# Patient Record
Sex: Male | Born: 1990 | Race: Black or African American | Hispanic: No | Marital: Single | Smoking: Current every day smoker
Health system: Southern US, Community
[De-identification: ages and names within clinical notes are randomized; demographics above are authoritative.]

## PROBLEM LIST (undated history)

## (undated) DIAGNOSIS — J45909 Unspecified asthma, uncomplicated: Secondary | ICD-10-CM

## (undated) DIAGNOSIS — K922 Gastrointestinal hemorrhage, unspecified: Secondary | ICD-10-CM

## (undated) DIAGNOSIS — F1011 Alcohol abuse, in remission: Secondary | ICD-10-CM

---

## 2013-03-06 ENCOUNTER — Emergency Department (HOSPITAL_COMMUNITY)
Admission: EM | Admit: 2013-03-06 | Discharge: 2013-03-06 | Disposition: A | Discharge status: Home / Self Care | Payer: Self-pay | Attending: Emergency Medicine | Admitting: Emergency Medicine

## 2013-03-06 ENCOUNTER — Encounter (HOSPITAL_COMMUNITY): Payer: Self-pay

## 2013-03-06 DIAGNOSIS — L03317 Cellulitis of buttock: Secondary | ICD-10-CM | POA: Insufficient documentation

## 2013-03-06 DIAGNOSIS — Z792 Long term (current) use of antibiotics: Secondary | ICD-10-CM | POA: Insufficient documentation

## 2013-03-06 DIAGNOSIS — L0291 Cutaneous abscess, unspecified: Secondary | ICD-10-CM

## 2013-03-06 DIAGNOSIS — F17200 Nicotine dependence, unspecified, uncomplicated: Secondary | ICD-10-CM | POA: Insufficient documentation

## 2013-03-06 DIAGNOSIS — L0231 Cutaneous abscess of buttock: Secondary | ICD-10-CM | POA: Insufficient documentation

## 2013-03-06 NOTE — ED Provider Notes (Signed)
CSN: [id number]     Arrival date & time 03/06/13  1940 History    This chart was scribed for Dorthula Matas, non-physician practitioner working with Gilda Crease, * by Leone Payor, ED Scribe. This patient was seen in room TR09C/TR09C and the patient's care was started at 1940.   First MD Initiated Contact with Patient 03/06/13 1954     Chief Complaint  Patient presents with  . Abscess   The history is provided by the patient. No language interpreter was used.    HPI Comments: Zeeshan Korte is a 22 y.o. male who presents to the Emergency Department complaining of an ongoing, constant, gradually worsening abscess to the right buttocks and posterior thigh area starting 10 days ago. He was seen at [facility] yesterday and prescribed doxycycline which he started last night. Pt also reports that the area started to drain on its own earlier today. States the pain has decreased since it started draining. Denies fever.   Pt is a current everyday smoker but denies alcohol use.  History reviewed. No pertinent past medical history. History reviewed. No pertinent past surgical history. History reviewed. No pertinent family history. History  Substance Use Topics  . Smoking status: Current Every Day Smoker  . Smokeless tobacco: Not on file  . Alcohol Use: No    Review of Systems  Constitutional: Negative for fever.  Skin: Positive for wound (abscess).  All other systems reviewed and are negative.    Allergies  Review of patient's allergies indicates no known allergies.  Home Medications   Current Outpatient Rx  Name  Route  Sig  Dispense  Refill  . doxycycline (VIBRA-TABS) 100 MG tablet   Oral   Take 100 mg by mouth 2 (two) times daily.          BP 142/91  Pulse 106  Temp(Src) 98.3 F (36.8 C) (Oral)  Resp 18  SpO2 96% Physical Exam  Nursing note and vitals reviewed. Constitutional: He appears well-developed and well-nourished. No distress.  HENT:  Head: Normocephalic  and atraumatic.  Eyes: Pupils are equal, round, and reactive to light.  Neck: Normal range of motion. Neck supple.  Cardiovascular: Normal rate and regular rhythm.   Pulmonary/Chest: Effort normal.  Abdominal: Soft.  Genitourinary:     Neurological: He is alert.  Skin: Skin is warm and dry.    ED Course   Procedures (including critical care time)  DIAGNOSTIC STUDIES: Oxygen Saturation is 96% on RA, adequate by my interpretation.    COORDINATION OF CARE: 8:09 PM Discussed treatment plan with pt at bedside and pt agreed to plan.   Labs Reviewed - No data to display No results found. 1. Abscess     MDM  Pt to continue doxy prescription. Return to ED precautions given. Abscess had drained on its own, pt concerned when he say it was bleeding and "pussing".  22 y.o.Matheau Iseman's evaluation in the Emergency Department is complete. It has been determined that no acute conditions requiring further emergency intervention are present at this time. The patient/guardian have been advised of the diagnosis and plan. We have discussed signs and symptoms that warrant return to the ED, such as changes or worsening in symptoms.  Vital signs are stable at discharge. Filed Vitals:   03/06/13 1948  BP:   Pulse:   Temp: 98.3 F (36.8 C)  Resp:     Patient/guardian has voiced understanding and agreed to follow-up with the PCP or specialist.  I personally performed the  services described in this documentation, which was scribed in my presence. The recorded information has been reviewed and is accurate.   Dorthula Matas, PA-C 03/06/13 2018

## 2013-03-06 NOTE — ED Notes (Signed)
Pt c/o pain to the area between the Right buttocks and posterior thigh d/t abscess x10 days. Pt reports increase pain on Sunday, seen at [facility] yesterday d/t increase pain with walking. Pt prescribed Doxycycline BID, pt started ABX last night. Pt reports increase pain today with walking and sitting. Pt also reports the area may have "busted open." Pt was instructed to f/u here for further evaluation if pain continued

## 2013-03-07 NOTE — ED Provider Notes (Signed)
Medical screening examination/treatment/procedure(s) were performed by non-physician practitioner and as supervising physician I was immediately available for consultation/collaboration.    Christopher J. Pollina, MD 03/07/13 1546 

## 2015-10-25 ENCOUNTER — Emergency Department (HOSPITAL_COMMUNITY)
Admission: EM | Admit: 2015-10-25 | Discharge: 2015-10-25 | Disposition: A | Discharge status: Home / Self Care | Payer: Self-pay | Attending: Emergency Medicine | Admitting: Emergency Medicine

## 2015-10-25 ENCOUNTER — Encounter (HOSPITAL_COMMUNITY): Payer: Self-pay

## 2015-10-25 DIAGNOSIS — K921 Melena: Secondary | ICD-10-CM | POA: Insufficient documentation

## 2015-10-25 DIAGNOSIS — F17200 Nicotine dependence, unspecified, uncomplicated: Secondary | ICD-10-CM | POA: Insufficient documentation

## 2015-10-25 DIAGNOSIS — K920 Hematemesis: Secondary | ICD-10-CM | POA: Insufficient documentation

## 2015-10-25 DIAGNOSIS — J45909 Unspecified asthma, uncomplicated: Secondary | ICD-10-CM | POA: Insufficient documentation

## 2015-10-25 DIAGNOSIS — K922 Gastrointestinal hemorrhage, unspecified: Secondary | ICD-10-CM

## 2015-10-25 HISTORY — DX: Unspecified asthma, uncomplicated: J45.909

## 2015-10-25 LAB — COMPREHENSIVE METABOLIC PANEL
ALT: 43 U/L (ref 17–63)
AST: 40 U/L (ref 15–41)
Albumin: 4.1 g/dL (ref 3.5–5.0)
Alkaline Phosphatase: 63 U/L (ref 38–126)
Anion gap: 13 (ref 5–15)
BILIRUBIN TOTAL: 0.4 mg/dL (ref 0.3–1.2)
BUN: 20 mg/dL (ref 6–20)
CO2: 26 mmol/L (ref 22–32)
CREATININE: 1.23 mg/dL (ref 0.61–1.24)
Calcium: 10 mg/dL (ref 8.9–10.3)
Chloride: 102 mmol/L (ref 101–111)
Glucose, Bld: 89 mg/dL (ref 65–99)
POTASSIUM: 3.8 mmol/L (ref 3.5–5.1)
Sodium: 141 mmol/L (ref 135–145)
TOTAL PROTEIN: 7 g/dL (ref 6.5–8.1)

## 2015-10-25 LAB — CBC WITH DIFFERENTIAL/PLATELET
BASOS PCT: 1 %
Basophils Absolute: 0.1 10*3/uL (ref 0.0–0.1)
EOS PCT: 1 %
Eosinophils Absolute: 0.1 10*3/uL (ref 0.0–0.7)
HEMATOCRIT: 40.6 % (ref 39.0–52.0)
Hemoglobin: 13.9 g/dL (ref 13.0–17.0)
LYMPHS ABS: 2.2 10*3/uL (ref 0.7–4.0)
Lymphocytes Relative: 30 %
MCH: 29.4 pg (ref 26.0–34.0)
MCHC: 34.2 g/dL (ref 30.0–36.0)
MCV: 85.8 fL (ref 78.0–100.0)
MONO ABS: 0.7 10*3/uL (ref 0.1–1.0)
MONOS PCT: 10 %
NEUTROS ABS: 4.2 10*3/uL (ref 1.7–7.7)
Neutrophils Relative %: 58 %
Platelets: 248 10*3/uL (ref 150–400)
RBC: 4.73 MIL/uL (ref 4.22–5.81)
RDW: 12.7 % (ref 11.5–15.5)
WBC: 7.3 10*3/uL (ref 4.0–10.5)

## 2015-10-25 LAB — PROTIME-INR
INR: 1.02 (ref 0.00–1.49)
PROTHROMBIN TIME: 13.6 s (ref 11.6–15.2)

## 2015-10-25 MED ORDER — PANTOPRAZOLE SODIUM 40 MG IV SOLR
40.0000 mg | Freq: Once | INTRAVENOUS | Status: AC
  Administered 2015-10-25: 40 mg via INTRAVENOUS
  Filled 2015-10-25: qty 40

## 2015-10-25 MED ORDER — SUCRALFATE 1 GM/10ML PO SUSP: 1.0000 g | Freq: Three times a day (TID) | ORAL | Status: AC

## 2015-10-25 MED ORDER — RANITIDINE HCL 150 MG PO TABS: 150.0000 mg | ORAL_TABLET | Freq: Two times a day (BID) | ORAL | Status: AC

## 2015-10-25 NOTE — Discharge Instructions (Signed)
Gastrointestinal Bleeding Gastrointestinal bleeding is bleeding somewhere along the path that food travels through the body (digestive tract). This path is anywhere between the mouth and the opening of the butt (anus). You may have blood in your throw up (vomit) or in your poop (stools). If there is a lot of bleeding, you may need to stay in the hospital. HOME CARE  Only take medicine as told by your doctor.  Eat foods with fiber such as whole grains, fruits, and vegetables. You can also try eating 1 to 3 prunes a day.  Drink enough fluids to keep your pee (urine) clear or pale yellow. GET HELP RIGHT AWAY IF:   Your bleeding gets worse.  You feel dizzy, weak, or you pass out (faint).  You have bad cramps in your back or belly (abdomen).  You have large blood clumps (clots) in your poop.  Your problems are getting worse. MAKE SURE YOU:   Understand these instructions.  Will watch your condition.  Will get help right away if you are not doing well or get worse.   This information is not intended to replace advice given to you by your health care provider. Make sure you discuss any questions you have with your health care provider.   Document Released: 05/04/2008 Document Revised: 07/12/2012 Document Reviewed: 01/13/2015 Elsevier Interactive Patient Education 2016 Elsevier Inc.  Hematemesis Hematemesis is when you vomit blood. It is a sign of bleeding in the upper part of your digestive tract. This is also called your gastrointestinal (GI) tract. Your upper GI tract includes your mouth, throat, esophagus, stomach, and the first part of your small intestine (duodenum).  Hematemesis is usually caused by bleeding from your esophagus or stomach. You may suddenly vomit bright red blood. You might also vomit old blood. It may look like coffee grounds. You may also have other symptoms, such as:  Stomach pain.  Heartburn.  Black and tarry stool.  HOME CARE INSTRUCTIONS  Watch your  hematemesis for any changes. The following actions may help to lessen any discomfort you are feeling:  Take medicines only as directed by your health care provider. Do not take aspirin, ibuprofen, or any other anti-inflammatory medicine without approval from your health care provider.  Rest as needed.  Drink small sips of clear liquids often, as long as you can keep them down. Try to drink enough fluids to keep your urine clear or pale yellow.  Do not drink alcohol.  Do not use any tobacco products, including cigarettes, chewing tobacco, or electronic cigarettes. If you need help quitting, ask your health care provider.  Keep all follow-up visits as directed by your health care provider. This is important. SEEK MEDICAL CARE IF:   The vomiting of blood worsens, or begins again after it has stopped.  You have persistent stomach pain.  You have nausea, indigestion, or heartburn.  You feel weak or dizzy. SEEK IMMEDIATE MEDICAL CARE IF:   You faint or feel extremely weak.  You have a rapid heartbeat.  You are urinating less than normal or not at all.  You have persistent vomiting.  You vomit large amounts of bloody or dark material.  You vomit bright red blood.  You pass large, dark, or bloody stools.  You have chest pain or trouble breathing.   This information is not intended to replace advice given to you by your health care provider. Make sure you discuss any questions you have with your health care provider.   Document Released: 09/02/2004  Document Revised: 08/16/2014 Document Reviewed: 03/20/2014 Elsevier Interactive Patient Education [employer] Elsevier Inc.

## 2015-10-25 NOTE — ED Notes (Signed)
Melena resolved. Last time was march 1st.

## 2015-10-25 NOTE — ED Provider Notes (Signed)
CSN: [id number]     Arrival date & time 10/25/15  0038 History  By signing my name below, I, Jeffrey [name], attest that this documentation has been prepared under the direction and in the presence of Jeffrey Crease, MD. Electronically Signed: Evon Slack, ED Scribe. 10/25/2015. 2:03 AM.    Chief Complaint  Patient presents with  . Hematemesis  . Melena   The history is provided by the patient. No language interpreter was used.   HPI Comments: Jeffrey [name] is a 25 y.o. male who presents to the Emergency Department complaining of hematemesis after heavy ETOH use onset 1 day prior. Pt describes the vomit as red and black in color. Pt reports Hx of acid reflux. He report the first episode of hematemesis was about 1 mont prior. He states that initially he vomited several times and then noticed blood. He states that now that every time he vomits he notices immediate blood. Pt doesn't report any medications PTA. He denies abdominal pain or other related symptoms. Pt also report that about 1 month prior he noticed blood in his stool. He states that he had black and tarry stool that has now resolved.      Past Medical History  Diagnosis Date  . Asthma    History reviewed. No pertinent past surgical history. No family history on file. Social History  Substance Use Topics  . Smoking status: Current Every Day Smoker  . Smokeless tobacco: None  . Alcohol Use: Yes    Review of Systems  Gastrointestinal: Positive for vomiting and blood in stool. Negative for abdominal pain.  All other systems reviewed and are negative.     Allergies  Review of patient's allergies indicates no known allergies.  Home Medications   Prior to Admission medications   Medication Sig Start Date End Date Taking? Authorizing Provider  Pseudoeph-Doxylamine-DM-APAP (NYQUIL PO) Take 1-2 capsules by mouth every 6 (six) hours as needed (cold).   Yes Historical Provider, MD   BP 139/86 mmHg  Pulse 71   Temp(Src) 99.1 F (37.3 C) (Oral)  Resp 19  Ht  (1.626 m)  Wt 192 lb 1 oz (87.119 kg)  BMI 32.95 kg/m2  SpO2 88%    Physical Exam  Constitutional: He is oriented to person, place, and time. He appears well-developed and well-nourished. No distress.  HENT:  Head: Normocephalic and atraumatic.  Right Ear: Hearing normal.  Left Ear: Hearing normal.  Nose: Nose normal.  Mouth/Throat: Oropharynx is clear and moist and mucous membranes are normal.  Eyes: Conjunctivae and EOM are normal. Pupils are equal, round, and reactive to light.  Neck: Normal range of motion. Neck supple.  Cardiovascular: Regular rhythm, S1 normal and S2 normal.  Exam reveals no gallop and no friction rub.   No murmur heard. Pulmonary/Chest: Effort normal and breath sounds normal. No respiratory distress. He exhibits no tenderness.  Abdominal: Soft. Normal appearance and bowel sounds are normal. There is no hepatosplenomegaly. There is no tenderness. There is no rebound, no guarding, no tenderness at McBurney's point and negative Murphy's sign. No hernia.  Musculoskeletal: Normal range of motion.  Neurological: He is alert and oriented to person, place, and time. He has normal strength. No cranial nerve deficit or sensory deficit. Coordination normal. GCS eye subscore is 4. GCS verbal subscore is 5. GCS motor subscore is 6.  Skin: Skin is warm, dry and intact. No rash noted. No cyanosis.  Psychiatric: He has a normal mood and affect. His speech is normal  and behavior is normal. Thought content normal.  Nursing note and vitals reviewed.   ED Course  Procedures (including critical care time) DIAGNOSTIC STUDIES: Oxygen Saturation is 98% on RA, normal by my interpretation.    COORDINATION OF CARE: 2:04 AM-Discussed treatment plan with pt at bedside and pt agreed to plan.     Labs Review Labs Reviewed  CBC WITH DIFFERENTIAL/PLATELET  PROTIME-INR  COMPREHENSIVE METABOLIC PANEL    Imaging Review No  results found.    EKG Interpretation None      MDM   Final diagnoses:  None   upper GI bleed  Patient presents to the ER for evaluation of intermittent hematemesis, coffee-ground emesis with melena. Symptoms have been ongoing for approximately a month. He reports that he has had episodes approximately 3 times over the last month. They seem to have been related to binge drinking alcohol. Patient likely experiencing alcoholic gastritis, however, he also reports pain with eating and might have peptic ulcer disease. His vital signs are stable, no tachycardia or hypotension. His hemoglobin is normal. This indicates that the patient can be evaluated as an outpatient by gastroenterology for endoscopy. Will treat with Zantac and Carafate, return for worsening symptoms.   I personally performed the services described in this documentation, which was scribed in my presence. The recorded information has been reviewed and is accurate.      Jeffrey Creasehristopher J Pollina, MD 10/25/15 [phone]

## 2015-10-25 NOTE — ED Notes (Signed)
Pt reports multiple episodes of binge drinking and has noticed blood in his vomit. He also reports acid reflux. Last episode of bloody emesis was yesterday morning (10-24-15) after drinking. Pt denies ETOH tonight.

## 2015-11-07 ENCOUNTER — Emergency Department (HOSPITAL_COMMUNITY): Payer: Self-pay

## 2015-11-07 ENCOUNTER — Emergency Department (HOSPITAL_COMMUNITY)
Admission: EM | Admit: 2015-11-07 | Discharge: 2015-11-07 | Disposition: A | Discharge status: Home / Self Care | Payer: Self-pay | Attending: Emergency Medicine | Admitting: Emergency Medicine

## 2015-11-07 ENCOUNTER — Encounter (HOSPITAL_COMMUNITY): Payer: Self-pay | Admitting: *Deleted

## 2015-11-07 DIAGNOSIS — B349 Viral infection, unspecified: Secondary | ICD-10-CM | POA: Insufficient documentation

## 2015-11-07 DIAGNOSIS — J45909 Unspecified asthma, uncomplicated: Secondary | ICD-10-CM | POA: Insufficient documentation

## 2015-11-07 DIAGNOSIS — Z79899 Other long term (current) drug therapy: Secondary | ICD-10-CM | POA: Insufficient documentation

## 2015-11-07 DIAGNOSIS — F17200 Nicotine dependence, unspecified, uncomplicated: Secondary | ICD-10-CM | POA: Insufficient documentation

## 2015-11-07 HISTORY — DX: Alcohol abuse, in remission: F10.11

## 2015-11-07 LAB — CBC WITH DIFFERENTIAL/PLATELET
BASOS ABS: 0 10*3/uL (ref 0.0–0.1)
Basophils Relative: 0 %
EOS ABS: 0 10*3/uL (ref 0.0–0.7)
EOS PCT: 0 %
HCT: 43.1 % (ref 39.0–52.0)
HEMOGLOBIN: 15 g/dL (ref 13.0–17.0)
LYMPHS ABS: 1.1 10*3/uL (ref 0.7–4.0)
Lymphocytes Relative: 20 %
MCH: 29.7 pg (ref 26.0–34.0)
MCHC: 34.8 g/dL (ref 30.0–36.0)
MCV: 85.3 fL (ref 78.0–100.0)
Monocytes Absolute: 0.6 10*3/uL (ref 0.1–1.0)
Monocytes Relative: 11 %
NEUTROS PCT: 69 %
Neutro Abs: 3.8 10*3/uL (ref 1.7–7.7)
PLATELETS: 210 10*3/uL (ref 150–400)
RBC: 5.05 MIL/uL (ref 4.22–5.81)
RDW: 12.2 % (ref 11.5–15.5)
WBC: 5.5 10*3/uL (ref 4.0–10.5)

## 2015-11-07 LAB — URINE MICROSCOPIC-ADD ON: WBC UA: NONE SEEN WBC/hpf (ref 0–5)

## 2015-11-07 LAB — URINALYSIS, ROUTINE W REFLEX MICROSCOPIC
BILIRUBIN URINE: NEGATIVE
Glucose, UA: NEGATIVE mg/dL
KETONES UR: 40 mg/dL — AB
LEUKOCYTES UA: NEGATIVE
NITRITE: NEGATIVE
PROTEIN: NEGATIVE mg/dL
SPECIFIC GRAVITY, URINE: 1.031 — AB (ref 1.005–1.030)
pH: 6 (ref 5.0–8.0)

## 2015-11-07 LAB — I-STAT CG4 LACTIC ACID, ED
LACTIC ACID, VENOUS: 1.16 mmol/L (ref 0.5–2.0)
LACTIC ACID, VENOUS: 1.13 mmol/L (ref 0.5–2.0)

## 2015-11-07 LAB — COMPREHENSIVE METABOLIC PANEL
ALK PHOS: 51 U/L (ref 38–126)
ALT: 21 U/L (ref 17–63)
ANION GAP: 12 (ref 5–15)
AST: 28 U/L (ref 15–41)
Albumin: 4.3 g/dL (ref 3.5–5.0)
BUN: 11 mg/dL (ref 6–20)
CALCIUM: 9.3 mg/dL (ref 8.9–10.3)
CHLORIDE: 99 mmol/L — AB (ref 101–111)
CO2: 24 mmol/L (ref 22–32)
CREATININE: 1.38 mg/dL — AB (ref 0.61–1.24)
Glucose, Bld: 102 mg/dL — ABNORMAL HIGH (ref 65–99)
Potassium: 3.8 mmol/L (ref 3.5–5.1)
SODIUM: 135 mmol/L (ref 135–145)
Total Bilirubin: 0.4 mg/dL (ref 0.3–1.2)
Total Protein: 7.6 g/dL (ref 6.5–8.1)

## 2015-11-07 MED ORDER — ONDANSETRON 4 MG PO TBDP
4.0000 mg | ORAL_TABLET | Freq: Once | ORAL | Status: AC
  Administered 2015-11-07: 4 mg via ORAL

## 2015-11-07 MED ORDER — ONDANSETRON 4 MG PO TBDP
ORAL_TABLET | ORAL | Status: AC
  Filled 2015-11-07: qty 1

## 2015-11-07 MED ORDER — ONDANSETRON 4 MG PO TBDP: 8.0000 mg | ORAL_TABLET | Freq: Once | ORAL | Status: DC

## 2015-11-07 MED ORDER — ACETAMINOPHEN 325 MG PO TABS
ORAL_TABLET | ORAL | Status: AC
  Filled 2015-11-07: qty 2

## 2015-11-07 MED ORDER — ACETAMINOPHEN 325 MG PO TABS
650.0000 mg | ORAL_TABLET | Freq: Once | ORAL | Status: AC
  Administered 2015-11-07: 650 mg via ORAL

## 2015-11-07 MED ORDER — SODIUM CHLORIDE 0.9 % IV BOLUS (SEPSIS)
1000.0000 mL | Freq: Once | INTRAVENOUS | Status: AC
  Administered 2015-11-07: 1000 mL via INTRAVENOUS

## 2015-11-07 NOTE — ED Provider Notes (Signed)
The patient is a 25 year old male who presents with nausea vomiting diarrhea abdominal discomfort as well as fever and a cough and myalgias. On exam the patient has a nontender abdomen, he is mildly tachycardic, his lungs are clear. He does cough frequently throughout the exam. His labs are unremarkable including his CBC and metabolic panel though he does have a slight renal insufficiency at baseline. His urinalysis does show ketones, it is consistent with dehydration, he has been given IV fluids, he should be stable for discharge with a cough medicine and antinausea medicine. He expresses understanding to the verbal plan.  Medical screening examination/treatment/procedure(s) were conducted as a shared visit with non-physician practitioner(s) and myself.  I personally evaluated the patient during the encounter.  Clinical Impression:   Final diagnoses:  Viral syndrome         Eber HongBrian Samil Mecham, MD 11/08/15 [phone]

## 2015-11-07 NOTE — Discharge Instructions (Signed)
Please read and follow all provided instructions.  Your diagnoses today include:  1. Viral syndrome    Tests performed today include:  Vital signs. See below for your results today.   Medications prescribed:   None   Home care instructions:  Follow any educational materials contained in this packet.  Follow-up instructions: Please follow-up with your primary care provider in the next 48 hours for further evaluation of symptoms and treatment   Return instructions:   Please return to the Emergency Department if you do not get better, if you get worse, or new symptoms OR  - Fever (temperature greater than 101.14F)  - Bleeding that does not stop with holding pressure to the area    -Severe pain (please note that you may be more sore the day after your accident)  - Chest Pain  - Difficulty breathing  - Severe nausea or vomiting  - Inability to tolerate food and liquids  - Passing out  - Skin becoming red around your wounds  - Change in mental status (confusion or lethargy)  - New numbness or weakness     Please return if you have any other emergent concerns.  Additional Information:  Your vital signs today were: BP 126/87 mmHg   Pulse 98   Temp(Src) 99.7 F (37.6 C) (Oral)   Resp 18   Ht 5\' 3"  (1.6 m)   Wt 86.183 kg   BMI 33.67 kg/m2   SpO2 98% If your blood pressure (BP) was elevated above 135/85 this visit, please have this repeated by your doctor within one month. ---------------

## 2015-11-07 NOTE — ED Notes (Addendum)
Pt states fevers, body aches, cough epigastric and back pain for several days.  Temp of 102.3 in triage.  Tx 2 weeks prior for esophageal bleed r/t etoh (pt states no etoh since then).  One episode of coffee grounds emesis several days ago.

## 2015-11-07 NOTE — ED Notes (Signed)
Pt stable, ambulatory, states understanding of discharge instructions 

## 2015-11-07 NOTE — ED Provider Notes (Signed)
CSN: [id number]     Arrival date & time 11/07/15  1625 History   First MD Initiated Contact with Patient 11/07/15 2006     Chief Complaint  Patient presents with  . Fever  . Emesis   (Consider location/radiation/quality/duration/timing/severity/associated sxs/prior Treatment) HPI 25 y.o. Jeffrey [name] presents to the Emergency Department today complaining of fever, generalized body aches. Associated cough, epigastric discomfort, rhinorrhea, sinus congestion, N/V. Notes symptoms for the past 2 days. No diarrhea. Notes body aches are 8/10. Dull ache. No OTC remedies. Notes sick contacts. No CP/SOB. No other symptoms noted.   Past Medical History  Diagnosis Date  . Asthma   . History of ETOH abuse    History reviewed. No pertinent past surgical history. No family history on file. Social History  Substance Use Topics  . Smoking status: Current Every Day Smoker  . Smokeless tobacco: None  . Alcohol Use: Yes     Comment: binge drinker    Review of Systems ROS reviewed and all are negative for acute change except as noted in the HPI.  Allergies  Review of patient's allergies indicates no known allergies.  Home Medications   Prior to Admission medications   Medication Sig Start Date End Date Taking? Authorizing Provider  Pseudoeph-Doxylamine-DM-APAP (NYQUIL PO) Take 1-2 capsules by mouth every 6 (six) hours as needed (cold).    Historical Provider, MD  ranitidine (ZANTAC) 150 MG tablet Take 1 tablet (150 mg total) by mouth 2 (two) times daily. 10/25/15   Gilda Crease, MD  sucralfate (CARAFATE) 1 GM/10ML suspension Take 10 mLs (1 g total) by mouth 4 (four) times daily -  with meals and at bedtime. 10/25/15   Gilda Crease, MD   BP 131/78 mmHg  Pulse 98  Temp(Src) 99.7 F (37.6 C) (Oral)  Resp 18  Ht  (1.6 m)  Wt 86.183 kg  BMI 33.67 kg/m2  SpO2 98%   Physical Exam  Constitutional: He is oriented to person, place, and time. He appears well-developed and  well-nourished. No distress.  HENT:  Head: Normocephalic and atraumatic.  Right Ear: Tympanic membrane, external ear and ear canal normal.  Left Ear: Tympanic membrane, external ear and ear canal normal.  Nose: Nose normal.  Mouth/Throat: Uvula is midline and mucous membranes are normal. No trismus in the jaw. Posterior oropharyngeal erythema present. No oropharyngeal exudate or tonsillar abscesses.  Eyes: EOM are normal. Pupils are equal, round, and reactive to light.  Neck: Normal range of motion. Neck supple. No tracheal deviation present.  Cardiovascular: Normal rate, regular rhythm, S1 normal, S2 normal, normal heart sounds, intact distal pulses and normal pulses.   Pulmonary/Chest: Effort normal and breath sounds normal. No respiratory distress. He has no decreased breath sounds. He has no wheezes. He has no rhonchi. He has no rales.  Abdominal: Normal appearance and bowel sounds are normal. There is no tenderness.  Musculoskeletal: Normal range of motion.  Neurological: He is alert and oriented to person, place, and time.  Skin: Skin is warm and dry.  Psychiatric: He has a normal mood and affect. His speech is normal and behavior is normal. Thought content normal.    ED Course  Procedures (including critical care time) Labs Review Labs Reviewed  COMPREHENSIVE METABOLIC PANEL - Abnormal; Notable for the following:    Chloride 99 (*)    Glucose, Bld 102 (*)    Creatinine, Ser 1.38 (*)    All other components within normal limits  URINALYSIS, ROUTINE W REFLEX MICROSCOPIC (  NOT AT [facility]) - Abnormal; Notable for the following:    APPearance CLOUDY (*)    Specific Gravity, Urine 1.031 (*)    Hgb urine dipstick TRACE (*)    Ketones, ur 40 (*)    All other components within normal limits  URINE MICROSCOPIC-ADD ON - Abnormal; Notable for the following:    Squamous Epithelial / LPF 0-5 (*)    Bacteria, UA RARE (*)    All other components within normal limits  URINE CULTURE  CBC WITH  DIFFERENTIAL/PLATELET  I-STAT CG4 LACTIC ACID, ED  I-STAT CG4 LACTIC ACID, ED   Imaging Review Dg Chest 2 View  11/07/2015  CLINICAL DATA:  Cough, body aches and vomiting for 3 days, history asthma, smoking, esophageal bleeding EXAM: CHEST  2 VIEW COMPARISON:  None FINDINGS: Upper normal heart size. Mediastinal contours and pulmonary vascularity normal. Bronchitic changes without infiltrate, pleural effusion or pneumothorax. No acute osseous findings. Visualized bowel gas pattern normal. IMPRESSION: Bronchitic changes without infiltrate. Electronically Signed   By: Ulyses SouthwardMark  Boles M.D.   On: 11/07/2015 17:44   I have personally reviewed and evaluated these images and lab results as part of my medical decision-making.   EKG Interpretation None      MDM  I have reviewed and evaluated the relevant laboratory values.I have reviewed and evaluated the relevant imaging studies.I personally evaluated and interpreted the relevant EKG.I have reviewed the relevant previous healthcare records.I have reviewed EMS Documentation.I obtained HPI from historian. Patient discussed with supervising physician  ED Course:  Assessment: Pt is a 25yM presents with URI symptoms x3 days . On exam, pt in NAD. VS show tachycardia and temp 102.3 initially, but 99.7 after Tylenol. Afebrile. Lungs CTA, Heart RRR. Abdomen nontender/soft. Pt CXR negative for acute infiltrate. Labs show no lactate. No WBC. UA shows no sign of infection. Given 1L NS bolus in ED. Tylenol as needed. Patients symptoms are consistent with URI, likely viral etiology. Discussed that antibiotics are not indicated for viral infections. Pt will be discharged with symptomatic treatment.  Verbalizes understanding and is agreeable with plan. Pt is hemodynamically stable & in NAD prior to dc.  Disposition/Plan:  DC home Additional Verbal discharge instructions given and discussed with patient.  Pt Instructed to f/u with PCP in the next 48 hours for evaluation  and treatment of symptoms. Return precautions given Pt acknowledges and agrees with plan  Supervising Physician Eber HongBrian Miller, MD   Final diagnoses:  Viral syndrome      [name] Remie Mathison, PA-C 11/07/15 2247  Eber HongBrian Miller, MD 11/08/15 [phone]

## 2015-11-09 LAB — URINE CULTURE
CULTURE: NO GROWTH
SPECIAL REQUESTS: NORMAL

## 2016-04-26 ENCOUNTER — Encounter (HOSPITAL_BASED_OUTPATIENT_CLINIC_OR_DEPARTMENT_OTHER): Payer: Self-pay | Admitting: Emergency Medicine

## 2016-04-26 ENCOUNTER — Emergency Department (HOSPITAL_BASED_OUTPATIENT_CLINIC_OR_DEPARTMENT_OTHER): Payer: Self-pay

## 2016-04-26 ENCOUNTER — Emergency Department (HOSPITAL_BASED_OUTPATIENT_CLINIC_OR_DEPARTMENT_OTHER)
Admission: EM | Admit: 2016-04-26 | Discharge: 2016-04-27 | Disposition: A | Discharge status: Home / Self Care | Payer: Self-pay | Attending: Emergency Medicine | Admitting: Emergency Medicine

## 2016-04-26 DIAGNOSIS — F17290 Nicotine dependence, other tobacco product, uncomplicated: Secondary | ICD-10-CM | POA: Insufficient documentation

## 2016-04-26 DIAGNOSIS — J40 Bronchitis, not specified as acute or chronic: Secondary | ICD-10-CM | POA: Insufficient documentation

## 2016-04-26 HISTORY — DX: Gastrointestinal hemorrhage, unspecified: K92.2

## 2016-04-26 NOTE — ED Triage Notes (Signed)
Pain in upper sternal area and just to right x2 days.  Worse with movement. Worse with cough and "yawning".

## 2016-04-26 NOTE — ED Provider Notes (Signed)
[facility] Provider Note   CSN: [id number] Arrival date & time: 04/26/16  2301  By signing my name below, I, Christel Mormon, attest that this documentation has been prepared under the direction and in the presence of Shon Baton, MD . Electronically Signed: Christel Mormon, Scribe. 04/27/2016. 12:15 AM.    History   Chief Complaint Chief Complaint  Patient presents with  . Chest Pain    The history is provided by the patient. No language interpreter was used.   HPI Comments:  Jeffrey [name] is a 25 y.o. male with PMHx of asthma and GI bleed who presents to the Emergency Department complaining of constant chest pain x2-3 days. Pt reports that he began to cough last night and pain began to worsen over the course of the day at work. Pt describes the pain as "tightness." Pt states that pain is worsened when moving, laying down, and yawning.  Current pain is 8/10. No alleviating factors noted. Pt denies nausea, vomiting, diarrhea, leg swelling. Current smoker.  Reports hx of asthma but no recent exacerbations.  Past Medical History:  Diagnosis Date  . Asthma   . GI bleed   . History of ETOH abuse     There are no active problems to display for this patient.   History reviewed. No pertinent surgical history.     Home Medications    Prior to Admission medications   Medication Sig Start Date End Date Taking? Authorizing Provider  acetaminophen (TYLENOL) 500 MG tablet Take 1 tablet (500 mg total) by mouth every 6 (six) hours as needed. 04/27/16   Shon Baton, MD  albuterol (PROVENTIL HFA;VENTOLIN HFA) 108 (90 Base) MCG/ACT inhaler Inhale 2 puffs into the lungs every 4 (four) hours as needed for wheezing or shortness of breath. 04/27/16   Shon Baton, MD  predniSONE (DELTASONE) 20 MG tablet Take 2 tablets (40 mg total) by mouth daily. 04/27/16   Shon Baton, MD  Pseudoeph-Doxylamine-DM-APAP (NYQUIL PO) Take 1-2 capsules by mouth every 6 (six) hours  as needed (cold).    Historical Provider, MD  ranitidine (ZANTAC) 150 MG tablet Take 1 tablet (150 mg total) by mouth 2 (two) times daily. 10/25/15   Gilda Crease, MD  sucralfate (CARAFATE) 1 GM/10ML suspension Take 10 mLs (1 g total) by mouth 4 (four) times daily -  with meals and at bedtime. 10/25/15   Gilda Crease, MD    Family History No family history on file.  Social History Social History  Substance Use Topics  . Smoking status: Current Every Day Smoker    Packs/day: 0.50    Types: Cigars  . Smokeless tobacco: Never Used  . Alcohol use 1.8 oz/week    3 Glasses of wine per week     Comment: binge drinker     Allergies   Review of patient's allergies indicates no known allergies.   Review of Systems Review of Systems  Respiratory: Positive for cough and chest tightness. Negative for shortness of breath and wheezing.   Cardiovascular: Positive for chest pain. Negative for leg swelling.  Gastrointestinal: Negative for diarrhea, nausea and vomiting.  All other systems reviewed and are negative.    Physical Exam Updated Vital Signs BP 144/85 (BP Location: Right Arm)   Pulse 77   Temp 99.2 F (37.3 C) (Oral)   Resp 16   Ht 5\' 3"  (1.6 m)   Wt 185 lb (83.9 kg)   SpO2 100%   BMI 32.77 kg/m  Physical Exam  Constitutional: He is oriented to person, place, and time. He appears well-developed and well-nourished. No distress.  HENT:  Head: Normocephalic and atraumatic.  Cardiovascular: Normal rate, regular rhythm and normal heart sounds.   No murmur heard. Pulmonary/Chest: Effort normal. No respiratory distress. He has wheezes. He exhibits no tenderness.  Scant wheeze mostly with coughing  Abdominal: Soft. There is no tenderness.  Musculoskeletal: He exhibits no edema.  Neurological: He is alert and oriented to person, place, and time.  Skin: Skin is warm and dry.  Psychiatric: He has a normal mood and affect.  Nursing note and vitals  reviewed.    ED Treatments / Results  DIAGNOSTIC STUDIES:  Oxygen Saturation is 100% on RA, normal by my interpretation.    COORDINATION OF CARE:  12:15 AM Discussed treatment plan with pt at bedside and pt agreed to plan.   Labs (all labs ordered are listed, but only abnormal results are displayed) Labs Reviewed - No data to display  EKG  EKG Interpretation  Date/Time:  Monday April 26 2016 23:12:41 EDT Ventricular Rate:  69 PR Interval:  134 QRS Duration: 88 QT Interval:  380 QTC Calculation: 407 R Axis:   87 Text Interpretation:  Sinus rhythm with Premature atrial complexes with Abberant conduction Otherwise normal ECG No prior for comparison Confirmed by Alayha Babineaux  MD, Kamala Kolton ([id number]) on 04/26/2016 11:53:40 PM       Radiology Dg Chest 2 View  Result Date: 04/26/2016 CLINICAL DATA:  Initial evaluation for acute central chest pain for 3 days with cough. History of asthma. EXAM: CHEST  2 VIEW COMPARISON:  Prior radiograph from 11/07/2015. FINDINGS: The cardiac and mediastinal silhouettes are stable in size and contour, and remain within normal limits. The lungs are normally inflated. No airspace consolidation, pleural effusion, or pulmonary edema is identified. There is no pneumothorax. No acute osseous abnormality identified. IMPRESSION: No active cardiopulmonary disease. Electronically Signed   By: Rise Mu M.D.   On: 04/26/2016 23:37    Procedures Procedures (including critical care time)  Medications Ordered in ED Medications  albuterol (PROVENTIL HFA;VENTOLIN HFA) 108 (90 Base) MCG/ACT inhaler 2 puff (2 puffs Inhalation Given 04/27/16 0053)  ketorolac (TORADOL) 30 MG/ML injection 30 mg (30 mg Intramuscular Given 04/27/16 0047)     Initial Impression / Assessment and Plan / ED Course  I have reviewed the triage vital signs and the nursing notes.  Pertinent labs & imaging results that were available during my care of the patient were reviewed by me  and considered in my medical decision making (see chart for details).  Clinical Course    Patient presents with cough and chest pain. Nontoxic on exam. Afebrile. Upper respiratory symptoms. No reproducible pain on exam however pain is worse with movement and coughing. EKG and chest x-ray are reassuring. Patient was given Toradol and an albuterol inhaler. Patient reports improvement of symptoms after medication. Suspect bronchitis causing chest wall pain. I have discussed with the patient that he needs to discontinue smoking. Patient stated understanding.  Final Clinical Impressions(s) / ED Diagnoses   Final diagnoses:  Bronchitis    New Prescriptions New Prescriptions   ACETAMINOPHEN (TYLENOL) 500 MG TABLET    Take 1 tablet (500 mg total) by mouth every 6 (six) hours as needed.   ALBUTEROL (PROVENTIL HFA;VENTOLIN HFA) 108 (90 BASE) MCG/ACT INHALER    Inhale 2 puffs into the lungs every 4 (four) hours as needed for wheezing or shortness of breath.   PREDNISONE (DELTASONE)  20 MG TABLET    Take 2 tablets (40 mg total) by mouth daily.   I personally performed the services described in this documentation, which was scribed in my presence. The recorded information has been reviewed and is accurate.     Shon Batonourtney F Cliff Damiani, MD 04/27/16 [phone]

## 2016-04-27 MED ORDER — ACETAMINOPHEN 500 MG PO TABS: 500.0000 mg | ORAL_TABLET | Freq: Four times a day (QID) | ORAL | 0 refills | Status: AC | PRN

## 2016-04-27 MED ORDER — KETOROLAC TROMETHAMINE 30 MG/ML IJ SOLN
30.0000 mg | Freq: Once | INTRAMUSCULAR | Status: AC
Start: 2016-04-27 — End: 2016-04-27
  Administered 2016-04-27: 30 mg via INTRAMUSCULAR
  Filled 2016-04-27: qty 1

## 2016-04-27 MED ORDER — PREDNISONE 20 MG PO TABS: 40.0000 mg | ORAL_TABLET | Freq: Every day | ORAL | 0 refills | Status: AC

## 2016-04-27 MED ORDER — ALBUTEROL SULFATE HFA 108 (90 BASE) MCG/ACT IN AERS: 2.0000 | INHALATION_SPRAY | RESPIRATORY_TRACT | 0 refills | Status: AC | PRN

## 2016-04-27 MED ORDER — ALBUTEROL SULFATE HFA 108 (90 BASE) MCG/ACT IN AERS
2.0000 | INHALATION_SPRAY | RESPIRATORY_TRACT | Status: DC | PRN
  Administered 2016-04-27: 2 via RESPIRATORY_TRACT
  Filled 2016-04-27: qty 6.7

## 2016-04-27 NOTE — Discharge Instructions (Signed)
You need to quit smoking.  

## 2016-12-02 IMAGING — CR DG CHEST 2V
2 series · 2 of 2 positions shown · non-contrast
Comparison: None

CLINICAL DATA: Cough, body aches and vomiting for 3 days, history
asthma, smoking, esophageal bleeding

EXAM:
CHEST  2 VIEW

[chest pa]
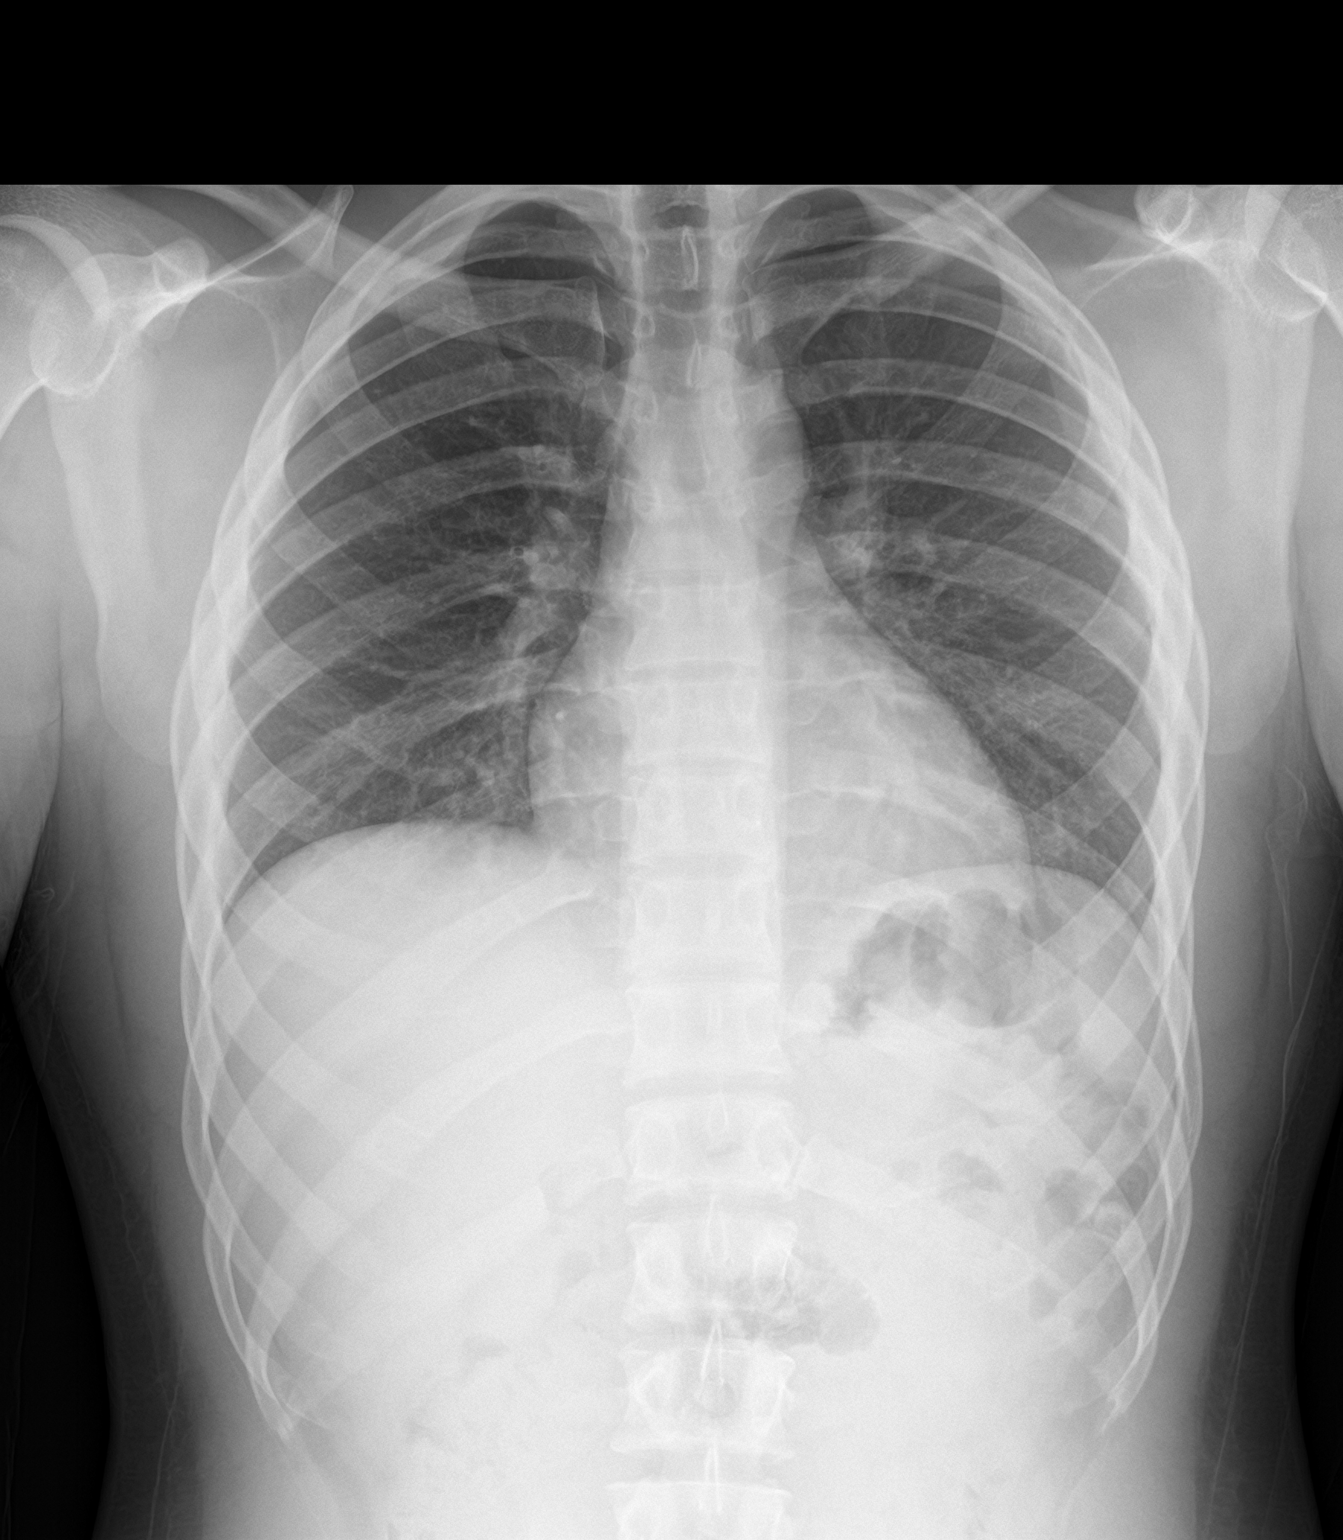

[chest lat]
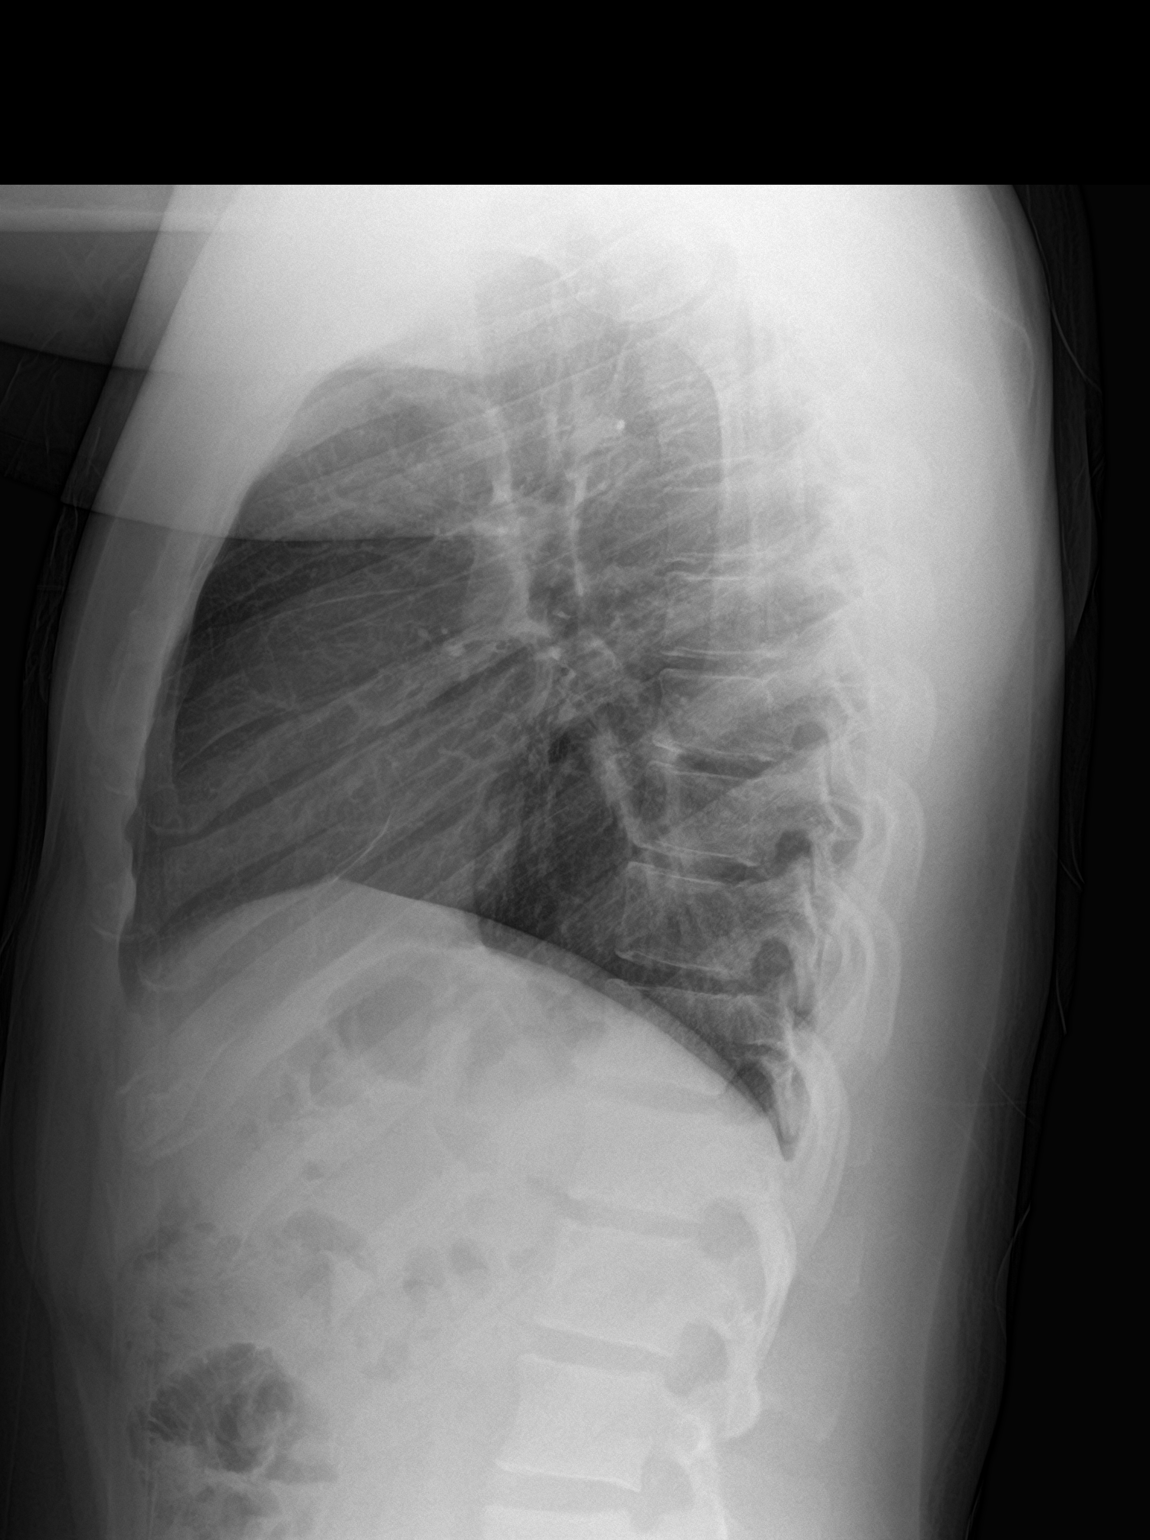

[2 of 2 positions shown; findings below may reference images not displayed]

FINDINGS: Upper normal heart size.

Mediastinal contours and pulmonary vascularity normal.

Bronchitic changes without infiltrate, pleural effusion or
pneumothorax.

No acute osseous findings.

Visualized bowel gas pattern normal.
IMPRESSION: Bronchitic changes without infiltrate.
